# Patient Record
Sex: Male | Born: 1945 | Race: White | Hispanic: No | Marital: Married | Smoking: Current every day smoker
Health system: Southern US, Community
[De-identification: ages and names within clinical notes are randomized; demographics above are authoritative.]

## PROBLEM LIST (undated history)

## (undated) DIAGNOSIS — C61 Malignant neoplasm of prostate: Secondary | ICD-10-CM

## (undated) HISTORY — DX: Malignant neoplasm of prostate: C61

---

## 1997-04-10 HISTORY — PX: PROSTATECTOMY: SHX69

## 2011-12-07 ENCOUNTER — Ambulatory Visit (INDEPENDENT_AMBULATORY_CARE_PROVIDER_SITE_OTHER)
Admission: RE | Admit: 2011-12-07 | Discharge: 2011-12-07 | Disposition: A | Discharge status: Home / Self Care | Payer: 59 | Source: Ambulatory Visit | Attending: Internal Medicine | Admitting: Internal Medicine

## 2011-12-07 ENCOUNTER — Encounter: Payer: Self-pay | Admitting: Internal Medicine

## 2011-12-07 ENCOUNTER — Ambulatory Visit (INDEPENDENT_AMBULATORY_CARE_PROVIDER_SITE_OTHER): Payer: 59 | Admitting: Internal Medicine

## 2011-12-07 VITALS — BP 132/80 | HR 63 | Temp 98.3°F | Ht 68.0 in | Wt 193.6 lb

## 2011-12-07 DIAGNOSIS — J4489 Other specified chronic obstructive pulmonary disease: Secondary | ICD-10-CM

## 2011-12-07 DIAGNOSIS — J449 Chronic obstructive pulmonary disease, unspecified: Secondary | ICD-10-CM | POA: Insufficient documentation

## 2011-12-07 DIAGNOSIS — F17200 Nicotine dependence, unspecified, uncomplicated: Secondary | ICD-10-CM

## 2011-12-07 MED ORDER — ACLIDINIUM BROMIDE 400 MCG/ACT IN AEPB: 1.0000 | INHALATION_SPRAY | RESPIRATORY_TRACT | Status: DC

## 2011-12-07 MED ORDER — VARENICLINE TARTRATE 0.5 MG X 11 & 1 MG X 42 PO MISC: ORAL | Status: DC

## 2011-12-07 NOTE — Assessment & Plan Note (Signed)
>   3 min discussion  I reviewed the Flethcher curve with patient that basically indicates  if you quit smoking when your best day FEV1 is still well preserved (which his appears to be) it is highly unlikely you will progress to severe disease and informed the patient there was no medication on the market that has proven to change the curve or the likelihood of progression.  Therefore stopping smoking and maintaining abstinence is the most important aspect of care, not choice of inhalers or for that matter, doctors.    rec trial of chantix

## 2011-12-07 NOTE — Progress Notes (Signed)
  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  No obvious daytime variabilty   or cp or chest tightness, subjective wheeze overt sinus or hb symptoms. No unusual exp hx or h/o childhood pna/ asthma or premature birth to his knowledge.  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  Sleeping ok without nocturnal  or early am exacerbation  of respiratory  c/o's or need for noct saba. Also denies any obvious fluctuation of symptoms with weather or environmental changes or other aggravating or alleviating factors except as outlined above    Review of Systems  Constitutional: Negative for fever, chills, activity change, appetite change and unexpected weight change.  HENT: Negative for congestion, sore throat, rhinorrhea, sneezing, trouble swallowing, dental problem, voice change and postnasal drip.   Eyes: Negative for visual disturbance.  Respiratory: Positive for shortness of breath. Negative for cough and choking.   Cardiovascular: Negative for chest pain and leg swelling.  Gastrointestinal: Negative for nausea, vomiting and abdominal pain.  Genitourinary: Negative for difficulty urinating.  Musculoskeletal: Negative for arthralgias.  Skin: Negative for rash.  Psychiatric/Behavioral: Negative for behavioral problems and confusion.       Objective:   Physical Exam amb wm nad Wt 193 12/07/2011 HEENT mild turbinate edema.  Oropharynx no thrush or excess pnd or cobblestoning.  No JVD or cervical adenopathy. Min accessory muscle hypertrophy. Trachea midline, nl thryroid. Chest was slt hyperinflated by percussion with diminished  breath sounds and mild increased exp time without wheeze. Hoover sign positive at late inspiration. Regular rate and rhythm without murmur gallop or rub or increase P2 or edema.  Abd: no hsm, nl excursion. Ext warm without cyanosis or clubbing.   CXR  12/07/2011 :  No evidence of acute cardiopulmonary disease.  Hyperinflation/emphysematous changes.        Assessment & Plan:

## 2011-12-07 NOTE — Progress Notes (Signed)
Quick Note:  Spoke with pt and notified of results per Dr. Wert. Pt verbalized understanding and denied any questions.  ______ 

## 2011-12-07 NOTE — Assessment & Plan Note (Signed)
Relatively mild but limited by doe with min cough and no variability to suggest AB component so good candidate for LAMA  rec tudorza one bid  The proper method of use, as well as anticipated side effects, of a metered-dose inhaler are discussed and demonstrated to the patient. Improved effectiveness after extensive coaching during this visit to a level of approximately  90%

## 2011-12-07 NOTE — Patient Instructions (Addendum)
The key is to stop smoking completely before smoking completely stops you!   Start Chantix now  Start tudorza one twice daily  Please remember to go to x-ray department downstairs for your tests - we will call you with the results when they are available.    Please schedule a follow up office visit in 6 weeks, call sooner if needed

## 2012-01-16 ENCOUNTER — Ambulatory Visit (INDEPENDENT_AMBULATORY_CARE_PROVIDER_SITE_OTHER): Payer: 59 | Admitting: Internal Medicine

## 2012-01-16 ENCOUNTER — Encounter: Payer: Self-pay | Admitting: Internal Medicine

## 2012-01-16 VITALS — BP 132/80 | HR 58 | Temp 98.0°F | Ht 69.0 in | Wt 196.0 lb

## 2012-01-16 DIAGNOSIS — F17200 Nicotine dependence, unspecified, uncomplicated: Secondary | ICD-10-CM

## 2012-01-16 DIAGNOSIS — J449 Chronic obstructive pulmonary disease, unspecified: Secondary | ICD-10-CM

## 2012-01-16 DIAGNOSIS — I10 Essential (primary) hypertension: Secondary | ICD-10-CM

## 2012-01-16 MED ORDER — NEBIVOLOL HCL 5 MG PO TABS: 5.0000 mg | ORAL_TABLET | Freq: Every day | ORAL | Status: DC

## 2012-01-16 MED ORDER — BUDESONIDE-FORMOTEROL FUMARATE 160-4.5 MCG/ACT IN AERO: INHALATION_SPRAY | RESPIRATORY_TRACT | Status: DC

## 2012-01-16 NOTE — Progress Notes (Signed)
  Subjective:    Patient ID: Dale Gibson, male    DOB: February 03, 1946   MRN: [id number]   Brief patient profile:  5 yowm  Vet/ primary care at [facility] / [employer] active smoker with worsening sob x 2013 self referred to pulmonary clinic 12/07/2011    12/07/2011 1st pulmonary eval cc indolent onset x sev year minimally progresssive sob assoc with congested cough > min yellow mucus.  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 rec The key is to stop smoking completely before smoking completely stops you!  Start Chantix now> "could not afford it" Start tudorza one twice daily > no better Please schedule a follow up office visit in 6 weeks    01/16/2012 f/u ov/Burnell Matlin cc sob worse with climate in [place] loading unloading,  In addition cough worse esp at hs > min productive gray, No obvious daytime variabilty or   cp or chest tightness, subjective wheeze overt sinus or hb symptoms. No unusual exp hx or h/o childhood pna/ asthma or premature birth to his knowledge.  No better on tudorza  Sleeping ok without nocturnal  or early am exacerbation  of respiratory  c/o's or need for noct saba. Also denies any obvious fluctuation of symptoms with weather or environmental changes or other aggravating or alleviating factors except as outlined above.  ROS  The following are not active complaints unless bolded sore throat, dysphagia, dental problems, itching, sneezing,  nasal congestion or excess/ purulent secretions, ear ache,   fever, chills, sweats, unintended wt loss, pleuritic or exertional cp, hemoptysis,  orthopnea pnd or leg swelling, presyncope, palpitations, heartburn, abdominal pain, anorexia, nausea, vomiting, diarrhea  or change in bowel or urinary habits, change in stools or urine, dysuria,hematuria,  rash, arthralgias, visual complaints, headache, numbness weakness or ataxia or problems with walking or  coordination,  change in mood/affect or memory.             Objective:   Physical Exam amb wm nad/ mod hoarse  Wt 193 12/07/2011 > 01/16/2012 196 HEENT mild turbinate edema.  Oropharynx no thrush or excess pnd or cobblestoning.  No JVD or cervical adenopathy. Min accessory muscle hypertrophy. Trachea midline, nl thryroid. Chest was slt hyperinflated by percussion with diminished breath sounds and mild increased exp time without wheeze. Hoover sign positive at late inspiration. Regular rate and rhythm without murmur gallop or rub or increase P2 or edema.  Abd: no hsm, nl excursion. Ext warm without cyanosis or clubbing.   CXR  12/07/2011 :  No evidence of acute cardiopulmonary disease.  Hyperinflation/emphysematous changes.        Assessment & Plan:

## 2012-01-16 NOTE — Assessment & Plan Note (Addendum)
-   Spirometry 01/16/2012 FEV1 2.08 (60) Ratio 53  DDX of  difficult airways managment all start with A and  include Adherence, Ace Inhibitors, Acid Reflux, Active Sinus Disease, Alpha 1 Antitripsin deficiency, Anxiety masquerading as Airways dz,  ABPA,  allergy(esp in young), Aspiration (esp in elderly), Adverse effects of DPI,  Active smokers, plus two Bs  = Bronchiectasis and Beta blocker use..and one C= CHF   Active smoking > discussed separately   ? Beta blocker > change to bystolic, discussed separately  The proper method of use, as well as anticipated side effects, of a metered-dose inhaler are discussed and demonstrated to the patient. Improved effectiveness after extensive coaching during this visit to a level of approximately  75% so try symbicort as failed to improve on [place]

## 2012-01-16 NOTE — Assessment & Plan Note (Addendum)
>   3 min dis I reviewed the Flethcher curve with patient that basically indicates  if you quit smoking when your best day FEV1 is still well preserved (as his is) it is highly unlikely you will progress to severe disease and informed the patient there was no medication on the market that has proven to change the curve or the likelihood of progression.  Therefore stopping smoking and maintaining abstinence is the most important aspect of care, not choice of inhalers or for that matter, doctors.

## 2012-01-16 NOTE — Assessment & Plan Note (Signed)
Trial of bystolic 5mg  daily  in place of tenormin 50 mg   Strongly prefer in the setting of poorly controlled airway symptoms: Bystolic, the most beta -1  selective Beta blocker available in sample form, with bisoprolol the most selective generic choice  on the market.

## 2012-01-16 NOTE — Patient Instructions (Signed)
Symbicort 160 Take 2 puffs first thing in am and then another 2 puffs about 12 hours later.     Work on inhaler technique:  relax and gently blow all the way out then take a nice smooth deep breath back in, triggering the inhaler at same time you start breathing in.  Hold for up to 5 seconds if you can.  Rinse and gargle with water when done   If your mouth or throat starts to bother you,   I suggest you time the inhaler to your dental care and after using the inhaler(s) brush teeth and tongue with a baking soda containing toothpaste and when you rinse this out, gargle with it first to see if this helps your mouth and throat.    Stop atenolol  Start bystolic 5 mg one daily  Call me in two weeks to let me know if you like the new medications and we can call them in for you wherever you are, or you can return here when you're back in town and we can regroup   Regardless of treatment, stopping smoking is the most important aspect of your care

## 2012-01-18 ENCOUNTER — Ambulatory Visit: Payer: 59 | Admitting: Internal Medicine

## 2012-02-09 ENCOUNTER — Telehealth: Payer: Self-pay | Admitting: Internal Medicine

## 2012-02-09 MED ORDER — AMLODIPINE BESYLATE 5 MG PO TABS: 5.0000 mg | ORAL_TABLET | Freq: Every day | ORAL | Status: DC

## 2012-02-09 NOTE — Telephone Encounter (Signed)
Pt advised and rx sent. Jennifer Castillo, CMA  

## 2012-02-09 NOTE — Telephone Encounter (Signed)
Ok dc bystolic  Start amlodipine 5mg  daily; send 30 day supply with 1 refill  - he is to monitor bp  - if sbp > 160 to call his PMD or Dr Sherene Sires  - side effect: common one is edema

## 2012-02-12 ENCOUNTER — Telehealth: Payer: Self-pay | Admitting: Internal Medicine

## 2012-02-12 NOTE — Telephone Encounter (Signed)
Noted  

## 2012-02-26 ENCOUNTER — Telehealth: Payer: Self-pay | Admitting: Internal Medicine

## 2012-02-26 DIAGNOSIS — J449 Chronic obstructive pulmonary disease, unspecified: Secondary | ICD-10-CM

## 2012-02-26 NOTE — Telephone Encounter (Signed)
Ok with me 

## 2012-02-26 NOTE — Telephone Encounter (Signed)
I do not see mention of pulm rehab on last ov MW, do you want to go ahead and make referral for this? Please advise, thanks!

## 2012-02-27 NOTE — Telephone Encounter (Signed)
Pt aware referral has been placed. Aware their is a bout 10 week waiting period. Nothing further was needed

## 2012-03-08 ENCOUNTER — Telehealth: Payer: Self-pay | Admitting: Internal Medicine

## 2012-03-08 NOTE — Telephone Encounter (Signed)
Called and spoke with pt and he is aware of appt scheduled to see MW on 12/6 at 11:45.  Pt is aware to bring all meds to this appt. Called and spoke with [place] from [employer] and she stated that we can just call back once the pt has seen MW and give these answers to the questions below.  Will forward to leslie to make her aware.  thanks

## 2012-03-08 NOTE — Telephone Encounter (Signed)
Not possible to answer any of these questions until ov - ok to move it up, be sure he brings all active meds/ inhalers otcs etc with him to office visit

## 2012-03-08 NOTE — Telephone Encounter (Signed)
Called the insurance company and they have several questions that the needed answered by MW.    1.  Estimated return to work date 2.  Current restrictions or limitations 3.  What they the restrictions/limitations during therapy 4.  Will pt be able to work before or during therapy 5. When will therapy start for the pt 6.  What is the treatment plan 7.  Next ov date with provider---12/27 with MW.   MW please advise. thanks

## 2012-03-13 NOTE — Telephone Encounter (Signed)
Pt has OV on 03-15-12 with MW.

## 2012-03-15 ENCOUNTER — Other Ambulatory Visit (INDEPENDENT_AMBULATORY_CARE_PROVIDER_SITE_OTHER): Payer: 59

## 2012-03-15 ENCOUNTER — Ambulatory Visit (INDEPENDENT_AMBULATORY_CARE_PROVIDER_SITE_OTHER)
Admission: RE | Admit: 2012-03-15 | Discharge: 2012-03-15 | Disposition: A | Discharge status: Home / Self Care | Payer: 59 | Source: Ambulatory Visit | Attending: Internal Medicine | Admitting: Internal Medicine

## 2012-03-15 ENCOUNTER — Ambulatory Visit (INDEPENDENT_AMBULATORY_CARE_PROVIDER_SITE_OTHER): Payer: 59 | Admitting: Internal Medicine

## 2012-03-15 ENCOUNTER — Encounter: Payer: Self-pay | Admitting: Internal Medicine

## 2012-03-15 VITALS — BP 100/70 | HR 94 | Temp 97.1°F | Ht 72.0 in | Wt 195.0 lb

## 2012-03-15 DIAGNOSIS — R519 Headache, unspecified: Secondary | ICD-10-CM | POA: Insufficient documentation

## 2012-03-15 DIAGNOSIS — J449 Chronic obstructive pulmonary disease, unspecified: Secondary | ICD-10-CM

## 2012-03-15 DIAGNOSIS — R0600 Dyspnea, unspecified: Secondary | ICD-10-CM

## 2012-03-15 DIAGNOSIS — I10 Essential (primary) hypertension: Secondary | ICD-10-CM

## 2012-03-15 DIAGNOSIS — R0989 Other specified symptoms and signs involving the circulatory and respiratory systems: Secondary | ICD-10-CM

## 2012-03-15 DIAGNOSIS — R51 Headache: Secondary | ICD-10-CM

## 2012-03-15 DIAGNOSIS — F17200 Nicotine dependence, unspecified, uncomplicated: Secondary | ICD-10-CM

## 2012-03-15 DIAGNOSIS — R0609 Other forms of dyspnea: Secondary | ICD-10-CM

## 2012-03-15 LAB — CBC WITH DIFFERENTIAL/PLATELET
Eosinophils Relative: 3.3 % (ref 0.0–5.0)
HCT: 45.7 % (ref 39.0–52.0)
Lymphs Abs: 3.2 10*3/uL (ref 0.7–4.0)
MCV: 94.7 fl (ref 78.0–100.0)
Monocytes Absolute: 0.7 10*3/uL (ref 0.1–1.0)
Neutro Abs: 5.6 10*3/uL (ref 1.4–7.7)
Platelets: 244 10*3/uL (ref 150.0–400.0)
WBC: 9.9 10*3/uL (ref 4.5–10.5)

## 2012-03-15 LAB — BASIC METABOLIC PANEL
CO2: 26 mEq/L (ref 19–32)
Calcium: 8.9 mg/dL (ref 8.4–10.5)
Creatinine, Ser: 1 mg/dL (ref 0.4–1.5)
Glucose, Bld: 95 mg/dL (ref 70–99)
Sodium: 137 mEq/L (ref 135–145)

## 2012-03-15 NOTE — Progress Notes (Signed)
  Subjective:    Patient ID: Dale Gibson, male    DOB: 06-Jan-1946   MRN: [id number]   Brief patient profile:  69 yowm  Vet/ primary care at [facility] / [employer] active smoker with worsening sob x 2013 self referred to pulmonary clinic 12/07/2011    12/07/2011 1st pulmonary eval cc indolent onset x sev year minimally progresssive sob assoc with congested cough > min yellow mucus.  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 rec The key is to stop smoking completely before smoking completely stops you!  Start Chantix now> "could not afford it" Start tudorza one twice daily > no better Please schedule a follow up office visit in 6 weeks    01/16/2012 f/u ov/Wert cc sob worse with climate in [place]loading,  In addition cough worse esp at hs > min productive gray. rec Symbicort 160 Take 2 puffs first thing in am and then another 2 puffs about 12 hours later.  Work on inhaler technique:  Stop atenolol Start bystolic 5 mg one daily   [employer] requesting  1.  Estimated return to work date > never 2.  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  Will pt be able to work before or during therapy> yes (stopping smoking is the therapy) 5. When will therapy start for the pt (it's up to him) 6.  What is the treatment plan(stop smoking)      03/15/2012 f/u ov/Wert cc no benefit with the change in terms of breathing, then new problem nausea and ha x months happens 2-3 x per week,  No pattern in terms of trigger, duration, resolves spontaneously.  Sleeping ok without nocturnal  or early am exacerbation  of respiratory  c/o's or need for noct saba. Also denies any obvious fluctuation of symptoms with weather or environmental changes or other aggravating or alleviating factors except as outlined above.  ROS  The  following are not active complaints unless bolded sore throat, dysphagia, dental problems, itching, sneezing,  nasal congestion or excess/ purulent secretions, ear ache,   fever, chills, sweats, unintended wt loss, pleuritic or exertional cp, hemoptysis,  orthopnea pnd or leg swelling, presyncope, palpitations, heartburn, abdominal pain, anorexia, nausea, vomiting, diarrhea  or change in bowel or urinary habits, change in stools or urine, dysuria,hematuria,  rash, arthralgias, visual complaints, headache, numbness weakness or ataxia or problems with walking or coordination,  change in mood/affect or memory.             Objective:   Physical Exam  amb wm nad/ mod hoarse   Wt 193 12/07/2011 > 01/16/2012 196 > 195 03/15/2012  HEENT mild turbinate edema.  Oropharynx no thrush or excess pnd or cobblestoning.  No JVD or cervical adenopathy. Min accessory muscle hypertrophy. Trachea midline, nl thryroid. Chest was slt hyperinflated by percussion with diminished breath sounds and mild increased exp time without wheeze. Hoover sign positive at late inspiration. Regular rate and rhythm without murmur gallop or rub or increase P2 or edema.  Abd: no hsm, nl excursion. Ext warm without cyanosis or clubbing.   CXR  03/15/2012 : COPD changes. No acute abnormalities.         Assessment & Plan:

## 2012-03-15 NOTE — Assessment & Plan Note (Signed)
-   Failed lama = tudorza 12/2011   - HFA 01/16/2012 75% > symbicort 160 2bid trial > no better   - Spirometry 01/16/2012 FEV1 2.08 (60) Ratio 53   - Refer to Rehab 02/26/2012   At fev1 > 2 liters he should be able to do everything but heavy exertion; unfortunately, that's the only job he knows but at age 66 unlikely to be retrainable for sedentary work> advised.  No further pulmonary rx needed x smoking cessation, discussed separately

## 2012-03-15 NOTE — Patient Instructions (Signed)
The key is to stop smoking completely before smoking completely stops you!   Reduce atenolol to 50 mg one half daily  Please remember to go to the lab and x-ray department downstairs for your tests - we will call you with the results when they are available.

## 2012-03-15 NOTE — Assessment & Plan Note (Signed)
ESR 6 03/15/2012 > rec Head CT  No evidence of neuro deficits but new onset/ chronicity bothersome so do rec he proceed with head ct

## 2012-03-15 NOTE — Assessment & Plan Note (Signed)
Did not apparently tolerate bystolic and over treated on tenormin 50 mg daily rec reduce dose to 25 mg daily and [facility] or with primary doctor of his choice

## 2012-03-15 NOTE — Assessment & Plan Note (Signed)
>   3 min  I took an extended  opportunity with this patient to outline the consequences of continued cigarette use  in airway disorders based on all the data we have from the multiple national lung health studies (perfomed over decades at millions of dollars in cost)  indicating that smoking cessation, not choice of inhalers or physicians, is the most important aspect of care.   No further pulmonary medications indicated nor pulmonary f/u

## 2012-03-18 ENCOUNTER — Telehealth: Payer: Self-pay | Admitting: Internal Medicine

## 2012-03-18 DIAGNOSIS — J449 Chronic obstructive pulmonary disease, unspecified: Secondary | ICD-10-CM

## 2012-03-18 NOTE — Telephone Encounter (Signed)
All questions answered and documented at last ov (see note)

## 2012-03-18 NOTE — Telephone Encounter (Signed)
Ok to refer to rehab?

## 2012-03-18 NOTE — Progress Notes (Signed)
Quick Note:  Spoke with pt and notified of results per Dr. Wert. Pt verbalized understanding and denied any questions.  ______ 

## 2012-03-18 NOTE — Telephone Encounter (Signed)
Spoke with pt. I advised we can just fax him ins company the note from his last ov He states this is what he needs Will call me back tomorrow with the fax number it needs to go to.

## 2012-03-18 NOTE — Telephone Encounter (Signed)
Spoke with patient, patient wanted to know when he would be starting pulm rehab.  Informed patient that there is a 10 wk waiting period before he would hear anything.   Patient also wanted to know if Dr, Sherene Sires has been able to answer question listed from telephone note on 03/08/12 regarding his insurance/disability.  Dr. Sherene Sires please advise if this has been done   Sandrea Hughs, MD 03/08/2012 11:14 AM Signed  Not possible to answer any of these questions until ov - ok to move it up, be sure he brings all active meds/ inhalers otcs etc with him to office visit Dale Gibson, [facility] 03/08/2012 10:01 AM Signed  Called the insurance company and they have several questions that the needed answered by MW.  1. Estimated return to work date  2. Current restrictions or limitations  3. What they the restrictions/limitations during therapy  4. Will pt be able to work before or during therapy  5. When will therapy start for the pt  6. What is the treatment plan  7. Next ov date with provider---12/27 with MW.  MW please advise. thanks

## 2012-03-18 NOTE — Telephone Encounter (Signed)
Called and spoke with the pt to give results of labs and cxr He is asking about referral to pulmonary rehab Is this something you wanted to order for him? He states that his ins company mentioned this to him Please advise thanks!

## 2012-03-19 NOTE — Telephone Encounter (Signed)
Pt states the fax number for Harrell Lark is  [phone].  Pt states his claim number [id number] will need to be included on any documents faxed from [place] to [facility].  Antionette Fairy

## 2012-03-19 NOTE — Telephone Encounter (Signed)
All of the pt's records were faxed to [employer] at the number given

## 2012-03-25 ENCOUNTER — Telehealth: Payer: Self-pay | Admitting: Internal Medicine

## 2012-03-25 NOTE — Telephone Encounter (Signed)
i spoke with pt and he is requesting to get CT scan of his head while he has insurance. He states he has already discussed this with MW. Please advise thanks

## 2012-03-25 NOTE — Telephone Encounter (Signed)
Spoke with pt and notified that CT head was ordered. He verbalized understanding and states nothing further needed.

## 2012-03-25 NOTE — Telephone Encounter (Signed)
Ok to order with contrast  Dx Chronic ha

## 2012-03-26 ENCOUNTER — Telehealth: Payer: Self-pay | Admitting: Internal Medicine

## 2012-03-26 NOTE — Telephone Encounter (Signed)
Spoke with Rose at [employer] and she states that head ct's are usually only done with contrast if they are looking for a mass or abcess on pt.  This was not indicated in dr Thurston Hole note so they will proceed with without contrast.

## 2012-03-27 ENCOUNTER — Encounter: Payer: Self-pay | Admitting: Internal Medicine

## 2012-03-27 ENCOUNTER — Ambulatory Visit (INDEPENDENT_AMBULATORY_CARE_PROVIDER_SITE_OTHER)
Admission: RE | Admit: 2012-03-27 | Discharge: 2012-03-27 | Disposition: A | Discharge status: Home / Self Care | Payer: 59 | Source: Ambulatory Visit | Attending: Internal Medicine | Admitting: Internal Medicine

## 2012-03-27 DIAGNOSIS — R51 Headache: Secondary | ICD-10-CM

## 2012-03-27 NOTE — Progress Notes (Signed)
Quick Note:  Spoke with pt and notified of results per Dr. Wert. Pt verbalized understanding and denied any questions.  ______ 

## 2012-03-28 ENCOUNTER — Other Ambulatory Visit: Payer: 59

## 2012-04-05 ENCOUNTER — Ambulatory Visit (INDEPENDENT_AMBULATORY_CARE_PROVIDER_SITE_OTHER): Payer: 59 | Admitting: Internal Medicine

## 2012-04-05 ENCOUNTER — Encounter: Payer: Self-pay | Admitting: Internal Medicine

## 2012-04-05 VITALS — BP 148/104 | HR 85 | Temp 97.8°F | Ht 69.0 in | Wt 194.0 lb

## 2012-04-05 DIAGNOSIS — J449 Chronic obstructive pulmonary disease, unspecified: Secondary | ICD-10-CM

## 2012-04-05 DIAGNOSIS — F17200 Nicotine dependence, unspecified, uncomplicated: Secondary | ICD-10-CM

## 2012-04-05 DIAGNOSIS — I10 Essential (primary) hypertension: Secondary | ICD-10-CM

## 2012-04-05 NOTE — Assessment & Plan Note (Signed)
No better symptoms on bystolic but better since stopped atenolol in terms of cough - would avoid beta blockers in future and consider diuretics/arb's which don't mimick or aggravate any of the pulmonary symptoms he's likely to develop  He is [facility] > rx deferred for now

## 2012-04-05 NOTE — Patient Instructions (Signed)
Ok off atenolol for now but Strongly prefer in this setting: Bystolic, the most beta -1  selective Beta blocker available in sample form, with bisoprolol the most selective generic choice  on the market.   Goal is less than 140 over 90 and you may be able to avoid than by avoiding alcohol and cigarettes.  The key is to stop smoking completely before smoking completely stops you - it's clearly not too late.  Follow up is here as needed

## 2012-04-05 NOTE — Progress Notes (Signed)
Subjective:    Patient ID: Dale Gibson, male    DOB: 03/04/1946   MRN: [id number]   Brief patient profile:  63 yowm  Vet/ primary care at [facility] [employer] active smoker with worsening sob x 2013 self referred to pulmonary clinic 12/07/2011    12/07/2011 1st pulmonary eval cc indolent onset x sev year minimally progresssive sob assoc with congested cough > min yellow mucus.  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 rec The key is to stop smoking completely before smoking completely stops you!  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 rec Symbicort 160 Take 2 puffs first thing in am and then another 2 puffs about 12 hours later.  Work on inhaler technique:  Stop atenolol Start bystolic 5 mg one daily   [employer] requesting  1.  Estimated return to work date > never 2.  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  Will pt be able to work before or during therapy> yes (stopping smoking is the therapy) 5. When will therapy start for the pt (it's up to him to schedule rehab 6.  What is the treatment plan(stop smoking)      03/15/2012 f/u ov/Leya Paige cc no benefit with the change in terms of breathing, then new problem nausea and ha x months happens 2-3 x per week. rec Atenolol 50 mg one half daily   04/05/2012 ov/Keairra Bardon still smoking  cc cough better off atenolol (stopped on his own and self monitor bp always ok x at this office)  Not worked since Sept 5 2013 due to doe x heavy lifting and walking up steep inclines like one deck or flight of steps and no better on lama/  laba/ics but note fev1 > 2 liters.    Sleeping ok without nocturnal  or early am exacerbation  of respiratory  c/o's or need for noct saba. Also denies any obvious fluctuation of symptoms with weather or environmental changes or other aggravating or alleviating factors except as outlined above.  ROS  The following are not active complaints unless bolded sore throat, dysphagia, dental problems, itching, sneezing,  nasal congestion or excess/ purulent secretions, ear ache,   fever, chills, sweats, unintended wt loss, pleuritic or exertional cp, hemoptysis,  orthopnea pnd or leg swelling, presyncope, palpitations, heartburn, abdominal pain, anorexia, nausea, vomiting, diarrhea  or change in bowel or urinary habits, change in stools or urine, dysuria,hematuria,  rash, arthralgias, visual complaints, headache, numbness weakness or ataxia or problems with walking or coordination,  change in mood/affect or memory.             Objective:   Physical Exam  amb wm nad/ mod hoarse   Wt 193 12/07/2011 > 01/16/2012 196 > 195 03/15/2012 > 194 04/05/2012  HEENT mild turbinate edema.  Oropharynx no thrush or excess pnd or cobblestoning.  No JVD or cervical adenopathy. Min accessory muscle hypertrophy. Trachea midline, nl thryroid. Chest was slt hyperinflated by percussion with diminished breath sounds and mild increased exp time without wheeze. Hoover sign positive at late inspiration.  Regular rate and rhythm without murmur gallop or rub or increase P2 or edema.  Abd: no hsm, nl excursion. Ext warm without cyanosis or clubbing.   CXR  03/15/2012 : COPD changes. No acute abnormalities.         Assessment & Plan:

## 2012-04-05 NOTE — Assessment & Plan Note (Signed)
-   Failed lama = tudorza 12/2011   - HFA 01/16/2012 75% > symbicort 160 2bid trial > no better   - Spirometry 01/16/2012 FEV1 2.08 (60) Ratio 53   - Referred to rehab 03/18/2012 >>>  I had an extended summary discussion with the patient today lasting 15 to 20 minutes of a 25 minute visit on the following issues:   His FEV1 > 2 liters is not limiting enough to prevent him from doing anything but the most strenuous work; unfortunately, this is exactly what his job is requesting of him and he can't be expected, esp at age 66, to work at this capacity any longer.  Strongly rec he quit smoking and keep appt with rehab

## 2012-04-05 NOTE — Assessment & Plan Note (Signed)
I reviewed the Flethcher curve with patient that basically indicates  if you quit smoking when your best day FEV1 is still well preserved (which his is) it is highly unlikely you will progress to severe disease and informed the patient there was no medication on the market that has proven to change the curve or the likelihood of progression.  Therefore stopping smoking and maintaining abstinence is the most important aspect of care, not choice of inhalers or for that matter, doctors.

## 2013-10-13 ENCOUNTER — Ambulatory Visit
Admission: RE | Admit: 2013-10-13 | Discharge: 2013-10-13 | Disposition: A | Discharge status: Home / Self Care | Payer: Medicare Other | Source: Ambulatory Visit | Attending: Family Medicine | Admitting: Family Medicine

## 2013-10-13 ENCOUNTER — Other Ambulatory Visit: Payer: Self-pay | Admitting: Family Medicine

## 2013-10-13 DIAGNOSIS — J449 Chronic obstructive pulmonary disease, unspecified: Secondary | ICD-10-CM

## 2014-11-26 ENCOUNTER — Other Ambulatory Visit: Payer: Self-pay | Admitting: Internal Medicine

## 2014-11-26 DIAGNOSIS — R911 Solitary pulmonary nodule: Secondary | ICD-10-CM

## 2014-12-02 ENCOUNTER — Ambulatory Visit
Admission: RE | Admit: 2014-12-02 | Discharge: 2014-12-02 | Disposition: A | Discharge status: Home / Self Care | Payer: No Typology Code available for payment source | Source: Ambulatory Visit | Attending: Internal Medicine | Admitting: Internal Medicine

## 2014-12-02 DIAGNOSIS — R911 Solitary pulmonary nodule: Secondary | ICD-10-CM

## 2016-04-14 ENCOUNTER — Ambulatory Visit (HOSPITAL_COMMUNITY): Payer: Non-veteran care

## 2019-05-31 ENCOUNTER — Ambulatory Visit: Payer: Medicare Other | Attending: Internal Medicine

## 2019-05-31 DIAGNOSIS — Z23 Encounter for immunization: Secondary | ICD-10-CM | POA: Insufficient documentation

## 2019-05-31 NOTE — Progress Notes (Signed)
   Covid-19 Vaccination Clinic  Name:  Dale Gibson    MRN: [id number] DOB: 1945-06-02  05/31/2019  [name] was observed post Covid-19 immunization for 15 minutes without incidence. He was provided with Vaccine Information Sheet and instruction to access the V-Safe system.   [name] was instructed to call 911 with any severe reactions post vaccine: Marland Kitchen Difficulty breathing  . Swelling of your face and throat  . A fast heartbeat  . A bad rash all over your body  . Dizziness and weakness    Immunizations Administered    Name Date Dose VIS Date Route   Pfizer COVID-19 Vaccine 05/31/2019  1:27 PM 0.3 mL 03/21/2019 Intramuscular   Manufacturer: [place]   Lot: [id number]   [place]: [id number]

## 2019-06-23 ENCOUNTER — Ambulatory Visit: Payer: Non-veteran care | Attending: Internal Medicine

## 2019-06-23 DIAGNOSIS — Z23 Encounter for immunization: Secondary | ICD-10-CM

## 2019-06-23 NOTE — Progress Notes (Signed)
   Covid-19 Vaccination Clinic  Name:  Dale Gibson    MRN: [id number] DOB: 1945/12/06  06/23/2019  Dale Gibson was observed post Covid-19 immunization for 15 minutes without incident. He was provided with Vaccine Information Sheet and instruction to access the V-Safe system.   Dale Gibson was instructed to call 911 with any severe reactions post vaccine: Marland Kitchen Difficulty breathing  . Swelling of face and throat  . A fast heartbeat  . A bad rash all over body  . Dizziness and weakness   Immunizations Administered    Name Date Dose VIS Date Route   Pfizer COVID-19 Vaccine 06/23/2019 10:06 AM 0.3 mL 03/21/2019 Intramuscular   Manufacturer: [place]   Lot: [id number]   [place]: [id number]

## 2019-08-13 LAB — PULMONARY FUNCTION TEST

## 2020-07-08 DIAGNOSIS — Z0001 Encounter for general adult medical examination with abnormal findings: Secondary | ICD-10-CM | POA: Diagnosis not present

## 2020-07-08 DIAGNOSIS — I471 Supraventricular tachycardia: Secondary | ICD-10-CM | POA: Diagnosis not present

## 2020-07-08 DIAGNOSIS — J439 Emphysema, unspecified: Secondary | ICD-10-CM | POA: Diagnosis not present

## 2020-07-08 DIAGNOSIS — D72829 Elevated white blood cell count, unspecified: Secondary | ICD-10-CM | POA: Diagnosis not present

## 2020-07-08 DIAGNOSIS — E7800 Pure hypercholesterolemia, unspecified: Secondary | ICD-10-CM | POA: Diagnosis not present

## 2020-07-08 DIAGNOSIS — Z79899 Other long term (current) drug therapy: Secondary | ICD-10-CM | POA: Diagnosis not present

## 2021-03-09 ENCOUNTER — Other Ambulatory Visit: Payer: Self-pay

## 2021-03-09 ENCOUNTER — Encounter: Payer: Self-pay | Admitting: Pulmonary Disease

## 2021-03-09 ENCOUNTER — Ambulatory Visit (INDEPENDENT_AMBULATORY_CARE_PROVIDER_SITE_OTHER): Payer: No Typology Code available for payment source | Admitting: Pulmonary Disease

## 2021-03-09 VITALS — BP 128/76 | HR 74 | Temp 98.4°F | Ht 68.0 in | Wt 180.0 lb

## 2021-03-09 DIAGNOSIS — J449 Chronic obstructive pulmonary disease, unspecified: Secondary | ICD-10-CM

## 2021-03-09 DIAGNOSIS — F17200 Nicotine dependence, unspecified, uncomplicated: Secondary | ICD-10-CM

## 2021-03-09 DIAGNOSIS — Z77090 Contact with and (suspected) exposure to asbestos: Secondary | ICD-10-CM

## 2021-03-09 DIAGNOSIS — F17210 Nicotine dependence, cigarettes, uncomplicated: Secondary | ICD-10-CM

## 2021-03-09 MED ORDER — ASMANEX HFA 100 MCG/ACT IN AERO: 2.0000 | INHALATION_SPRAY | Freq: Two times a day (BID) | RESPIRATORY_TRACT | 5 refills | Status: AC

## 2021-03-09 MED ORDER — STIOLTO RESPIMAT 2.5-2.5 MCG/ACT IN AERS: 2.0000 | INHALATION_SPRAY | Freq: Every day | RESPIRATORY_TRACT | 5 refills | Status: AC

## 2021-03-09 NOTE — Patient Instructions (Signed)
We will restart you on stiolto and Asmanex inhalers for your COPD Continue to work on smoking cessation Will get PFTs from the [place] for review Continue annual screening CT at the [place] Will schedule high-resolution CT here for evaluation of interstitial lung disease from asbestos exposure  Return to clinic in 3 months

## 2021-03-09 NOTE — Progress Notes (Signed)
Dale Gibson    [id number]    03-Feb-1946  Primary Care Physician:No primary care provider on file.  Referring Physician: Apolonio Schneiders, [place],  [place] [id number]  Chief complaint: Consult for COPD   HPI: 75 year old with history of severe COPD, hypertension.  Previously followed by Dr. Melvyn Novas and then at the [facility] Referred here for management of COPD  Is an active smoker up to 1 pack/day.  Has been maintained on Stiolto and Asmanex previously.  Stiolto was changed to Spiriva for unclear reason and he had worsening dyspnea and wants to go back on his previous inhaler regimen Complains of chronic dyspnea on exertion, cough with white/yellow mucus.  Denies any fevers, chills  He had PFTs several years ago and does not want to repeat.  Gets low-dose screening CT at the [place] with imaging findings of pleural thickening suggestive of asbestos exposure  Pets: Dog Occupation: Was in the [employer] [place] and then [employer].  Currently retired Exposures: Probable asbestos exposure in the past Smoking history: 60-pack-year smoker.  Continues to smoke 1 pack/day Travel history: No significant travel history.  Originally from [place] family history: No family history of lung disease  Outpatient Encounter Medications as of 03/09/2021  Medication Sig   Albuterol Sulfate (PROAIR RESPICLICK) 628 (90 Base) MCG/ACT AEPB 1 puff as needed   aspirin 325 MG tablet Take 325 mg by mouth every 6 (six) hours as needed for mild pain or headache.   Mometasone Furoate (ASMANEX HFA) 100 MCG/ACT AERO 2 puffs   prazosin (MINIPRESS) 1 MG capsule 1 capsule at bedtime   Tiotropium Bromide-Olodaterol (STIOLTO RESPIMAT) 2.5-2.5 MCG/ACT AERS 2 puffs   No facility-administered encounter medications on file as of 03/09/2021.    Allergies as of 03/09/2021   (No Known Allergies)    Past Medical History:  Diagnosis Date   Prostate cancer [facility])     Past Surgical History:  Procedure  Laterality Date   PROSTATECTOMY  1999    No family history on file.  Social History   Socioeconomic History   Marital status: Married    Spouse name: Not on file   Number of children: 0   Years of education: Not on file   Highest education level: Not on file  Occupational History   Occupation: Engineer, petroleum  Tobacco Use   Smoking status: Every Day    Packs/day: 1.50    Years: 45.00    Pack years: 67.50    Types: Cigarettes    Start date: 1966   Smokeless tobacco: Never   Tobacco comments:    1ppd as of 03/09/21 ep  Substance and Sexual Activity   Alcohol use: No   Drug use: No   Sexual activity: Not on file  Other Topics Concern   Not on file  Social History Narrative   Not on file   Social Determinants of Health   Financial Resource Strain: Not on file  Food Insecurity: Not on file  Transportation Needs: Not on file  Physical Activity: Not on file  Stress: Not on file  Social Connections: Not on file  Intimate Partner Violence: Not on file    Review of systems: Review of Systems  Constitutional: Negative for fever and chills.  HENT: Negative.   Eyes: Negative for blurred vision.  Respiratory: as per HPI  Cardiovascular: Negative for chest pain and palpitations.  Gastrointestinal: Negative for vomiting, diarrhea, blood per rectum. Genitourinary: Negative for dysuria, urgency, frequency and  hematuria.  Musculoskeletal: Negative for myalgias, back pain and joint pain.  Skin: Negative for itching and rash.  Neurological: Negative for dizziness, tremors, focal weakness, seizures and loss of consciousness.  Endo/Heme/Allergies: Negative for environmental allergies.  Psychiatric/Behavioral: Negative for depression, suicidal ideas and hallucinations.  All other systems reviewed and are negative.  Physical Exam: Blood pressure 128/76, pulse 74, temperature 98.4 F (36.9 C), temperature source Oral, height 5\' 8"  (1.727 m), weight 180 lb (81.6 kg), SpO2 96  %. Gen:      No acute distress HEENT:  EOMI, sclera anicteric Neck:     No masses; no thyromegaly Lungs:    Clear to auscultation bilaterally; normal respiratory effort CV:         Regular rate and rhythm; no murmurs Abd:      + bowel sounds; soft, non-tender; no palpable masses, no distension Ext:    No edema; adequate peripheral perfusion Skin:      Warm and dry; no rash Neuro: alert and oriented x 3 Psych: normal mood and affect  Data Reviewed: Imaging: Low-dose screening CT report from the [place] dated 11/08/2020 Stable benign calcified granuloma, stable subpleural lymph node in the left major fissure, stable right basal opacities, mild emphysema, noncalcified pleural plaques Recommend 1 year follow-up  PFTs:  Labs: Labs from the [place] CBC 11/90/21-WBC 8.7, eos 2.6%, absolute eosinophil count CMP within normal limits  Assessment:  Severe COPD Wants to go back to his previous inhaler regimen of Stiolto and Asmanex which appears to work for him.  We will place order for this Get report of PFTs from the [place].  He does not want to repeat the test here  Pleural plaques He has history of asbestos exposure and pleural plaques on screening CT of the chest Get high-res CT for evaluation of fibrosis  Active smoker Continues to smoke 1 pack/day.  Discussed smoking cessation in detail with patient.  He wants to try and quit on his own Reassess next return visit.  Time spent counseling-5 minutes  Plan/Recommendations: Stiolto, Asmanex High-res CT Smoking cessation Get PFT reports from [place] MD [facility] 03/09/2021, 2:55 PM  CC: Apolonio Schneiders, MD

## 2021-03-29 ENCOUNTER — Other Ambulatory Visit: Payer: Self-pay

## 2021-03-29 ENCOUNTER — Ambulatory Visit (INDEPENDENT_AMBULATORY_CARE_PROVIDER_SITE_OTHER)
Admission: RE | Admit: 2021-03-29 | Discharge: 2021-03-29 | Disposition: A | Discharge status: Home / Self Care | Payer: No Typology Code available for payment source | Source: Ambulatory Visit | Attending: Pulmonary Disease | Admitting: Pulmonary Disease

## 2021-03-29 DIAGNOSIS — J449 Chronic obstructive pulmonary disease, unspecified: Secondary | ICD-10-CM | POA: Diagnosis not present

## 2021-03-29 DIAGNOSIS — J432 Centrilobular emphysema: Secondary | ICD-10-CM | POA: Diagnosis not present

## 2021-03-29 DIAGNOSIS — I2510 Atherosclerotic heart disease of native coronary artery without angina pectoris: Secondary | ICD-10-CM | POA: Diagnosis not present

## 2021-03-29 DIAGNOSIS — J929 Pleural plaque without asbestos: Secondary | ICD-10-CM | POA: Diagnosis not present

## 2021-03-29 DIAGNOSIS — I700 Atherosclerosis of aorta: Secondary | ICD-10-CM | POA: Diagnosis not present

## 2021-03-29 DIAGNOSIS — Z77090 Contact with and (suspected) exposure to asbestos: Secondary | ICD-10-CM

## 2021-05-24 ENCOUNTER — Other Ambulatory Visit: Payer: Self-pay

## 2021-05-24 ENCOUNTER — Ambulatory Visit (INDEPENDENT_AMBULATORY_CARE_PROVIDER_SITE_OTHER): Payer: No Typology Code available for payment source | Admitting: Pulmonary Disease

## 2021-05-24 ENCOUNTER — Encounter: Payer: Self-pay | Admitting: Pulmonary Disease

## 2021-05-24 VITALS — BP 140/80 | HR 76 | Temp 97.8°F | Ht 69.0 in | Wt 178.4 lb

## 2021-05-24 DIAGNOSIS — J449 Chronic obstructive pulmonary disease, unspecified: Secondary | ICD-10-CM | POA: Diagnosis not present

## 2021-05-24 DIAGNOSIS — F17200 Nicotine dependence, unspecified, uncomplicated: Secondary | ICD-10-CM

## 2021-05-24 DIAGNOSIS — Z77090 Contact with and (suspected) exposure to asbestos: Secondary | ICD-10-CM

## 2021-05-24 NOTE — Patient Instructions (Signed)
I am glad you are stable with your breathing Continue low-dose screening CTs from the [place] We will get PFTs and CT reports from the [place] for our record Continue to work on smoking cessation Follow-up in 6 months

## 2021-05-24 NOTE — Progress Notes (Signed)
Dale Gibson    [id number]    October 20, 1945  Primary Care Physician:Clinic, [facility]  Referring Physician: No referring provider defined for this encounter.  Chief complaint: Follow-up for COPD   HPI: 76 year old with history of severe COPD, hypertension.  Previously followed by Dr. Melvyn Novas and then at the [facility] Referred here for management of COPD  Is an active smoker up to 1 pack/day.  Has been maintained on Stiolto and Asmanex previously.  Stiolto was changed to Spiriva for unclear reason and he had worsening dyspnea and wants to go back on his previous inhaler regimen Complains of chronic dyspnea on exertion, cough with white/yellow mucus.  Denies any fevers, chills  He had PFTs several years ago and does not want to repeat.  Gets low-dose screening CT at the [place] with imaging findings of pleural thickening suggestive of asbestos exposure  Pets: Dog Occupation: Was in the [employer] and then [employer].  Currently retired Exposures: Probable asbestos exposure in the past Smoking history: 60-pack-year smoker.  Continues to smoke 1 pack/day Travel history: No significant travel history.  Originally from [place] family history: No family history of lung disease  Interim history: Continues on stiolto and Asmanex.  States that breathing is stable with no issues Wants to stop the Asmanex as he feels he is on 2 new medications Continues to smoke 1 pack/day  Outpatient Encounter Medications as of 05/24/2021  Medication Sig   Albuterol Sulfate (PROAIR RESPICLICK) 160 (90 Base) MCG/ACT AEPB 1 puff as needed   aspirin 325 MG tablet Take 325 mg by mouth every 6 (six) hours as needed for mild pain or headache.   Mometasone Furoate (ASMANEX HFA) 100 MCG/ACT AERO Inhale 2 puffs into the lungs 2 (two) times daily.   prazosin (MINIPRESS) 1 MG capsule 1 capsule at bedtime   Tiotropium Bromide-Olodaterol (STIOLTO RESPIMAT) 2.5-2.5 MCG/ACT AERS Inhale 2 puffs into the lungs daily.    No facility-administered encounter medications on file as of 05/24/2021.   Physical Exam: Blood pressure 140/80, pulse 76, temperature 97.8 F (36.6 C), temperature source Oral, height 5\' 9"  (1.753 m), weight 178 lb 6.4 oz (80.9 kg), SpO2 98 %. Gen:      No acute distress HEENT:  EOMI, sclera anicteric Neck:     No masses; no thyromegaly Lungs:    Clear to auscultation bilaterally; normal respiratory effort CV:         Regular rate and rhythm; no murmurs Abd:      + bowel sounds; soft, non-tender; no palpable masses, no distension Ext:    No edema; adequate peripheral perfusion Skin:      Warm and dry; no rash Neuro: alert and oriented x 3 Psych: normal mood and affect   Data Reviewed: Imaging: Low-dose screening CT report from the [place] dated 11/08/2020 Stable benign calcified granuloma, stable subpleural lymph node in the left major fissure, stable right basal opacities, mild emphysema, noncalcified pleural plaques Recommend 1 year follow-up  PFTs: Spirometry 01/16/2012 FVC 3.9 [87%], FEV1 2.0 [57%], F/F 50 Moderate-severe obstruction  Labs: Labs from the [place] CBC 11/90/21-WBC 8.7, eos 2.6%, absolute eosinophil count 226.2 CMP within normal limits  Assessment:  Severe COPD Currently on stiolto and Asmanex.  He wants to limit the number of medications he is on and is planning on stopping the Asmanex This is reasonable as his eosinophils are not very high.  If there is any worsening then he needs to go back on the  inhaler  We discussed getting repeat PFTs but he does not want to repeat the test We will try to obtain old records from the [place]  Pleural plaques He has history of asbestos exposure and pleural plaques on screening CT of the chest High-res CT reviewed with no evidence of pulmonary fibrosis which is good news  Active smoker Continues to smoke 1 pack/day.  Discussed smoking cessation in detail with patient.  He wants to try and quit on his own Reassess next return  visit.  Time spent counseling-5 minutes He is getting annual low-dose screening CTs from the [place].  Will get those records for review  Plan/Recommendations: Stiolto.  Stopping Asmanex per patient preference Smoking cessation Get records from [place] MD [facility] 05/24/2021, 1:36 PM  CC: No ref. provider found

## 2021-07-11 DIAGNOSIS — Z0000 Encounter for general adult medical examination without abnormal findings: Secondary | ICD-10-CM | POA: Diagnosis not present

## 2021-07-11 DIAGNOSIS — Z79899 Other long term (current) drug therapy: Secondary | ICD-10-CM | POA: Diagnosis not present

## 2021-07-11 DIAGNOSIS — E7800 Pure hypercholesterolemia, unspecified: Secondary | ICD-10-CM | POA: Diagnosis not present

## 2021-07-11 DIAGNOSIS — D72829 Elevated white blood cell count, unspecified: Secondary | ICD-10-CM | POA: Diagnosis not present

## 2022-05-31 DIAGNOSIS — U071 COVID-19: Secondary | ICD-10-CM | POA: Diagnosis not present

## 2023-01-30 DIAGNOSIS — J439 Emphysema, unspecified: Secondary | ICD-10-CM | POA: Diagnosis not present

## 2023-01-30 DIAGNOSIS — Z23 Encounter for immunization: Secondary | ICD-10-CM | POA: Diagnosis not present

## 2023-01-30 DIAGNOSIS — Z0001 Encounter for general adult medical examination with abnormal findings: Secondary | ICD-10-CM | POA: Diagnosis not present

## 2023-01-30 DIAGNOSIS — E7800 Pure hypercholesterolemia, unspecified: Secondary | ICD-10-CM | POA: Diagnosis not present

## 2023-01-30 DIAGNOSIS — I700 Atherosclerosis of aorta: Secondary | ICD-10-CM | POA: Diagnosis not present

## 2023-01-30 DIAGNOSIS — I4719 Other supraventricular tachycardia: Secondary | ICD-10-CM | POA: Diagnosis not present

## 2023-01-30 DIAGNOSIS — Z79899 Other long term (current) drug therapy: Secondary | ICD-10-CM | POA: Diagnosis not present

## 2023-08-23 IMAGING — CT CT CHEST HIGH RESOLUTION
2 of 7 series · 14 of 36 positions shown, 17 images · non-contrast
Comparison: CT chest dated December 02, 2014

CLINICAL DATA: Asbestosis exposure

EXAM:
CT CHEST WITHOUT CONTRAST
TECHNIQUE: Multidetector CT imaging of the chest was performed following the
standard protocol without intravenous contrast. High resolution
imaging of the lungs, as well as inspiratory and expiratory imaging,
was performed.

[Series 4: high resolution · axial · 0.69mm/px · z∈[+1332,+1596]mm · 11 of 319 slices shown, 14 images]
[im 27/319  mediastinal]
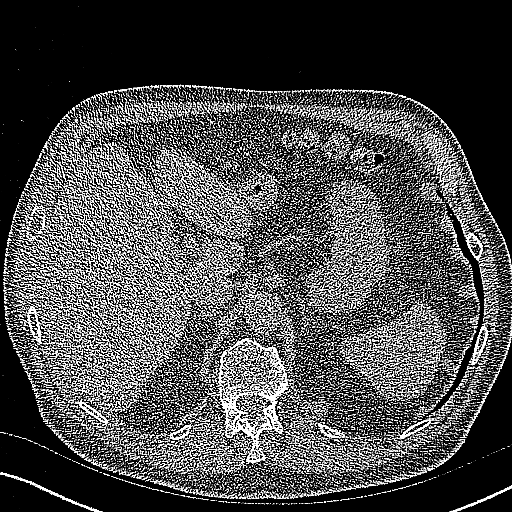
[im 27/319  lung]
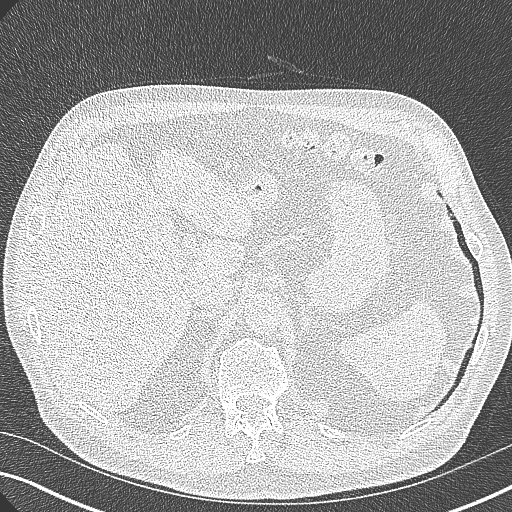
[im 54/319  lung]
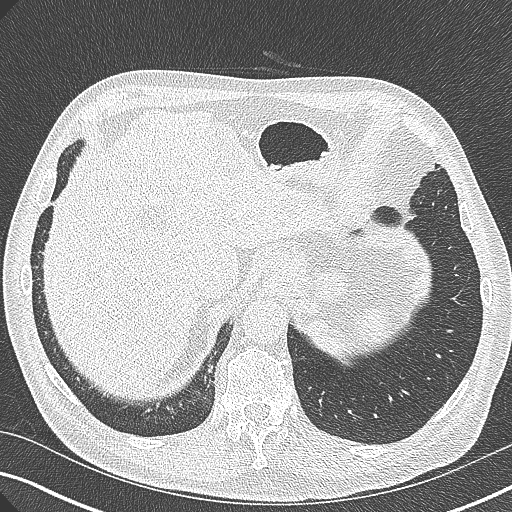
[im 80/319  lung]
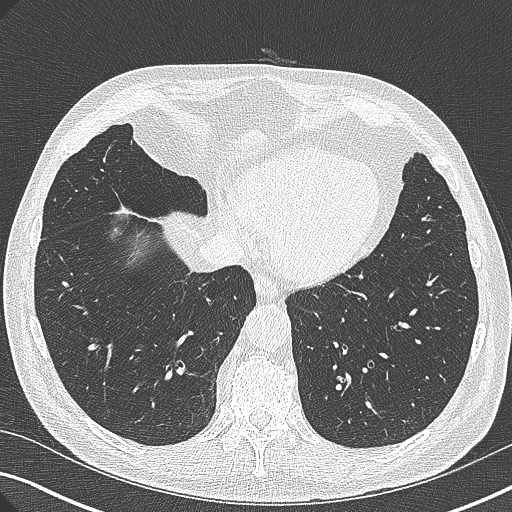
[im 107/319  lung]
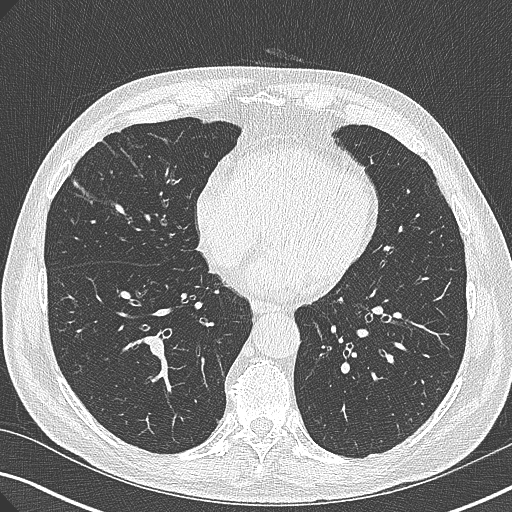
[im 133/319  mediastinal]
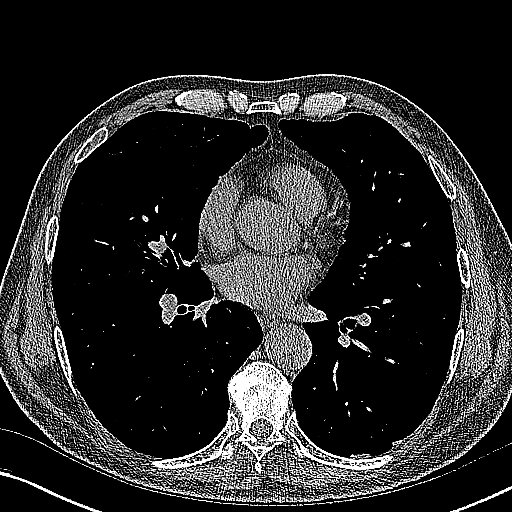
[im 133/319  lung]
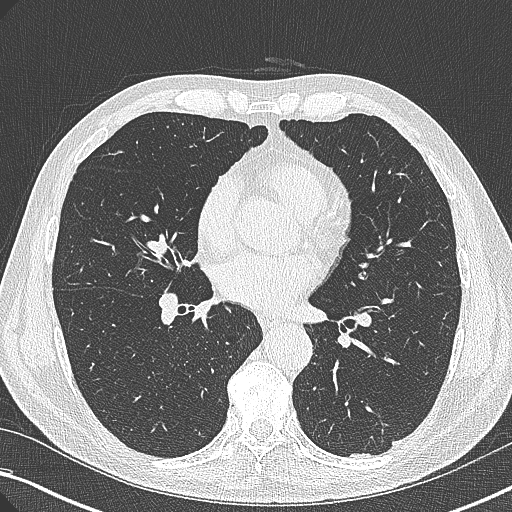
[im 160/319  lung]
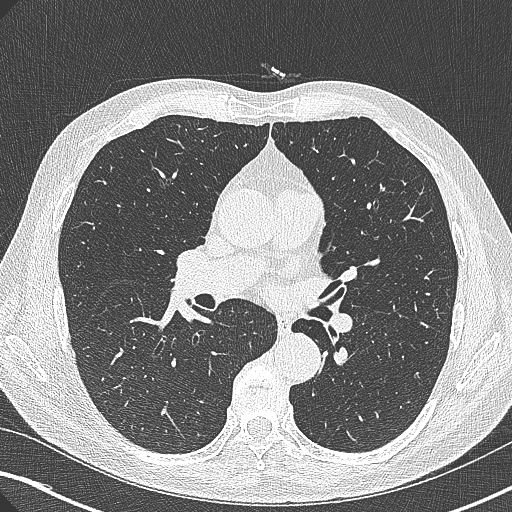
[im 186/319  lung]
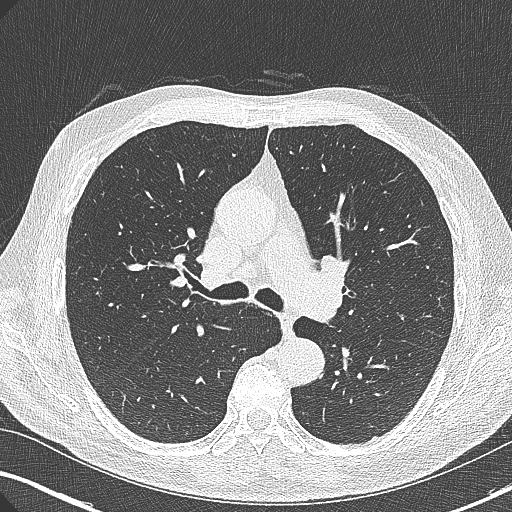
[im 213/319  lung]
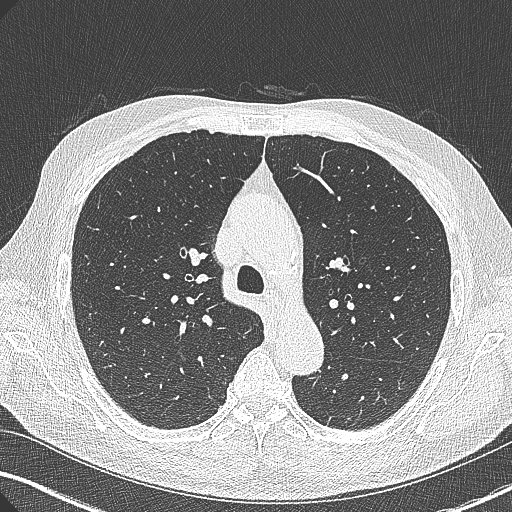
[im 239/319  mediastinal]
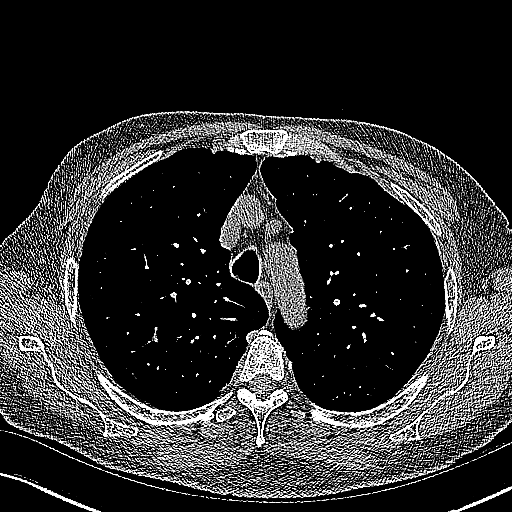
[im 239/319  lung]
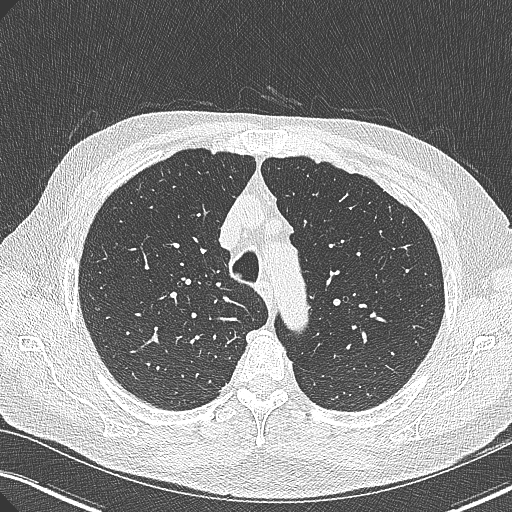
[im 266/319  lung]
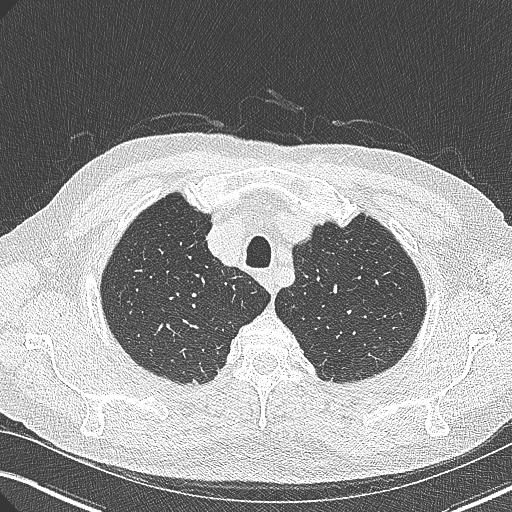
[im 292/319  lung]
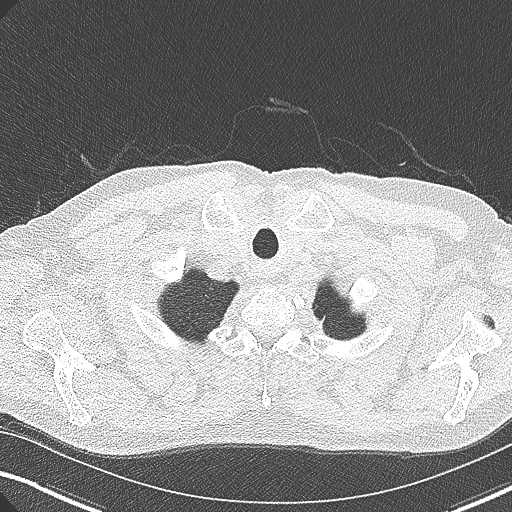

[Series 7: coronal · coronal · 0.66mm/px · 3 of 154 slices shown]
[im 31/154  lung]
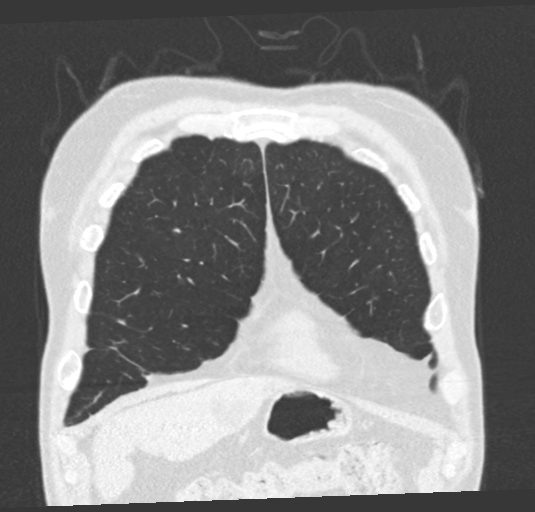
[im 62/154  lung]
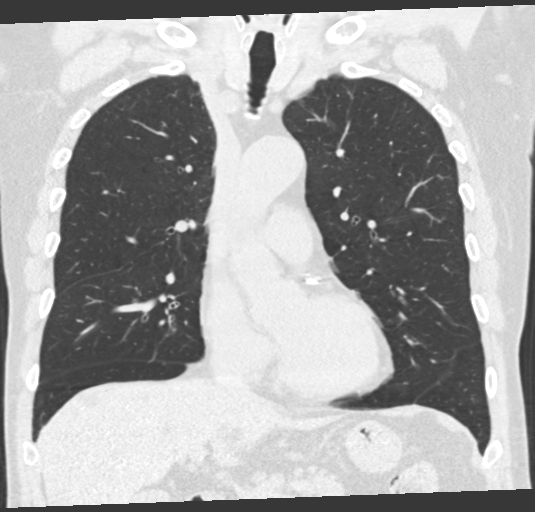
[im 92/154  lung]
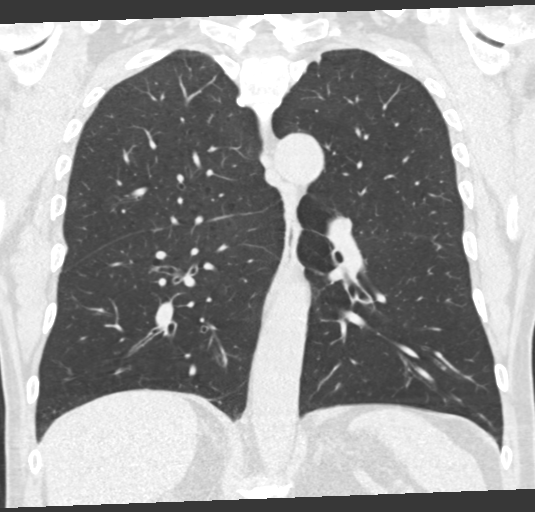

[14 of 36 positions shown; findings below may reference images not displayed]

FINDINGS: Cardiovascular: Normal heart size. No pericardial effusion.
Three-vessel coronary artery calcifications. Atherosclerotic disease
of the thoracic aorta.

Mediastinum/Nodes: No pathologically enlarged lymph nodes seen in
the chest. Esophagus and thyroid are unremarkable.

Lungs/Pleura: Small amount of layering debris seen in the trachea.
No significant air trapping. Mild centrilobular emphysema. Pleural
plaques of the left hemithorax. No reticular opacities, traction
bronchiectasis or honeycomb change. Mild linear opacities of the
left lower lobe, right middle lobe and lingula, likely due to
scarring or atelectasis. Small scattered solid pulmonary nodules.
Largest is located in the left upper lobe and measures 5 mm on
series 4, image 61.

Upper Abdomen: Numerous low-attenuation lesions are seen throughout
the liver which are likely simple cysts.

Musculoskeletal: No chest wall mass or suspicious bone lesions
identified.
IMPRESSION: 1. Pleural plaques of the left hemithorax, findings can be seen in
the setting of asbestos exposure. No evidence of interstitial lung
disease.
2. Solid 5 mm pulmonary nodule of the left upper lobe. No follow-up
needed if patient is low-risk. Non-contrast chest CT can be
considered in 12 months if patient is high-risk. This recommendation
follows the consensus statement: Guidelines for Management of
Incidental Pulmonary Nodules Detected on CT Images: From the
3. Three-vessel coronary artery calcifications, recommend ASCVD risk
assessment.
4. Aortic Atherosclerosis (BE0V8-HNF.F) and Emphysema (BE0V8-QEP.L).

## 2024-01-01 DIAGNOSIS — H31021 Solar retinopathy, right eye: Secondary | ICD-10-CM | POA: Diagnosis not present

## 2024-01-01 DIAGNOSIS — H52223 Regular astigmatism, bilateral: Secondary | ICD-10-CM | POA: Diagnosis not present

## 2024-01-01 DIAGNOSIS — H2513 Age-related nuclear cataract, bilateral: Secondary | ICD-10-CM | POA: Diagnosis not present

## 2024-01-01 DIAGNOSIS — H5213 Myopia, bilateral: Secondary | ICD-10-CM | POA: Diagnosis not present

## 2024-01-15 DIAGNOSIS — F17210 Nicotine dependence, cigarettes, uncomplicated: Secondary | ICD-10-CM | POA: Diagnosis not present
